# Patient Record
Sex: Female | Born: 1965 | Race: White | Hispanic: No | Marital: Married | Smoking: Never smoker
Health system: Southern US, Community
[De-identification: ages and names within clinical notes are randomized; demographics above are authoritative.]

## PROBLEM LIST (undated history)

## (undated) DIAGNOSIS — N950 Postmenopausal bleeding: Secondary | ICD-10-CM

## (undated) DIAGNOSIS — J45909 Unspecified asthma, uncomplicated: Secondary | ICD-10-CM

## (undated) DIAGNOSIS — F419 Anxiety disorder, unspecified: Secondary | ICD-10-CM

## (undated) DIAGNOSIS — D649 Anemia, unspecified: Secondary | ICD-10-CM

## (undated) DIAGNOSIS — M1990 Unspecified osteoarthritis, unspecified site: Secondary | ICD-10-CM

## (undated) DIAGNOSIS — F32A Depression, unspecified: Secondary | ICD-10-CM

## (undated) HISTORY — DX: Unspecified osteoarthritis, unspecified site: M19.90

## (undated) HISTORY — DX: Anemia, unspecified: D64.9

## (undated) HISTORY — DX: Anxiety disorder, unspecified: F41.9

## (undated) HISTORY — DX: Postmenopausal bleeding: N95.0

## (undated) HISTORY — DX: Unspecified asthma, uncomplicated: J45.909

## (undated) HISTORY — DX: Depression, unspecified: F32.A

## (undated) HISTORY — PX: BREAST BIOPSY: SHX20

---
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
## 1998-06-27 ENCOUNTER — Other Ambulatory Visit: Admission: RE | Admit: 1998-06-27 | Discharge: 1998-06-27 | Payer: Self-pay | Admitting: Obstetrics and Gynecology

## 1999-07-20 ENCOUNTER — Other Ambulatory Visit: Admission: RE | Admit: 1999-07-20 | Discharge: 1999-07-20 | Payer: Self-pay | Admitting: Obstetrics and Gynecology

## 1999-08-31 HISTORY — PX: OTHER SURGICAL HISTORY: SHX169

## 2000-01-04 ENCOUNTER — Other Ambulatory Visit: Admission: RE | Admit: 2000-01-04 | Discharge: 2000-01-04 | Payer: Self-pay | Admitting: Obstetrics and Gynecology

## 2001-07-29 ENCOUNTER — Emergency Department (HOSPITAL_COMMUNITY): Admission: EM | Admit: 2001-07-29 | Discharge: 2001-07-30 | Payer: Self-pay | Admitting: Emergency Medicine

## 2001-07-29 ENCOUNTER — Encounter: Payer: Self-pay | Admitting: Emergency Medicine

## 2001-08-07 ENCOUNTER — Emergency Department (HOSPITAL_COMMUNITY): Admission: EM | Admit: 2001-08-07 | Discharge: 2001-08-07 | Payer: Self-pay

## 2001-12-13 ENCOUNTER — Ambulatory Visit (HOSPITAL_COMMUNITY): Admission: RE | Admit: 2001-12-13 | Discharge: 2001-12-13 | Payer: Self-pay | Admitting: Family Medicine

## 2001-12-13 ENCOUNTER — Encounter: Payer: Self-pay | Admitting: Family Medicine

## 2002-09-19 ENCOUNTER — Other Ambulatory Visit: Admission: RE | Admit: 2002-09-19 | Discharge: 2002-09-19 | Payer: Self-pay | Admitting: Obstetrics and Gynecology

## 2003-02-19 ENCOUNTER — Encounter: Admission: RE | Admit: 2003-02-19 | Discharge: 2003-02-19 | Payer: Self-pay | Admitting: Sports Medicine

## 2003-10-23 ENCOUNTER — Other Ambulatory Visit: Admission: RE | Admit: 2003-10-23 | Discharge: 2003-10-23 | Payer: Self-pay | Admitting: Obstetrics and Gynecology

## 2004-06-05 ENCOUNTER — Encounter
Admission: RE | Admit: 2004-06-05 | Discharge: 2004-06-05 | Payer: Self-pay | Admitting: Physical Medicine and Rehabilitation

## 2004-10-20 ENCOUNTER — Other Ambulatory Visit: Admission: RE | Admit: 2004-10-20 | Discharge: 2004-10-20 | Payer: Self-pay | Admitting: Obstetrics and Gynecology

## 2005-10-25 ENCOUNTER — Other Ambulatory Visit: Admission: RE | Admit: 2005-10-25 | Discharge: 2005-10-25 | Payer: Self-pay | Admitting: Obstetrics and Gynecology

## 2006-12-08 ENCOUNTER — Encounter: Admission: RE | Admit: 2006-12-08 | Discharge: 2006-12-08 | Payer: Self-pay | Admitting: Obstetrics and Gynecology

## 2010-10-14 ENCOUNTER — Other Ambulatory Visit: Payer: Self-pay | Admitting: Obstetrics and Gynecology

## 2010-10-14 DIAGNOSIS — R928 Other abnormal and inconclusive findings on diagnostic imaging of breast: Secondary | ICD-10-CM

## 2010-10-20 ENCOUNTER — Ambulatory Visit
Admission: RE | Admit: 2010-10-20 | Discharge: 2010-10-20 | Disposition: A | Discharge status: Home / Self Care | Payer: Managed Care, Other (non HMO) | Source: Ambulatory Visit | Attending: Obstetrics and Gynecology | Admitting: Obstetrics and Gynecology

## 2010-10-20 DIAGNOSIS — R928 Other abnormal and inconclusive findings on diagnostic imaging of breast: Secondary | ICD-10-CM

## 2017-10-14 ENCOUNTER — Other Ambulatory Visit: Payer: Self-pay | Admitting: Obstetrics and Gynecology

## 2017-10-14 DIAGNOSIS — R928 Other abnormal and inconclusive findings on diagnostic imaging of breast: Secondary | ICD-10-CM

## 2017-10-19 ENCOUNTER — Ambulatory Visit
Admission: RE | Admit: 2017-10-19 | Discharge: 2017-10-19 | Disposition: A | Discharge status: Home / Self Care | Source: Ambulatory Visit | Attending: Obstetrics and Gynecology | Admitting: Obstetrics and Gynecology

## 2017-10-19 ENCOUNTER — Ambulatory Visit
Admission: RE | Admit: 2017-10-19 | Discharge: 2017-10-19 | Disposition: A | Discharge status: Home / Self Care | Payer: Self-pay | Source: Ambulatory Visit | Attending: Obstetrics and Gynecology | Admitting: Obstetrics and Gynecology

## 2017-10-19 ENCOUNTER — Other Ambulatory Visit: Payer: Self-pay | Admitting: Obstetrics and Gynecology

## 2017-10-19 ENCOUNTER — Ambulatory Visit
Admission: RE | Admit: 2017-10-19 | Discharge: 2017-10-19 | Disposition: A | Discharge status: Home / Self Care | Payer: Managed Care, Other (non HMO) | Source: Ambulatory Visit | Attending: Obstetrics and Gynecology | Admitting: Obstetrics and Gynecology

## 2017-10-19 DIAGNOSIS — N6310 Unspecified lump in the right breast, unspecified quadrant: Secondary | ICD-10-CM

## 2017-10-19 DIAGNOSIS — R928 Other abnormal and inconclusive findings on diagnostic imaging of breast: Secondary | ICD-10-CM

## 2019-08-28 IMAGING — MG DIGITAL DIAGNOSTIC UNILATERAL RIGHT MAMMOGRAM WITH TOMO AND CAD
6 series · 6 of 18 positions shown · non-contrast
Comparison: 10/12/2017 and earlier

CLINICAL DATA: Patient returns after screening study for evaluation
of RIGHT breast asymmetry.

EXAM:
DIGITAL DIAGNOSTIC RIGHT MAMMOGRAM WITH CAD AND TOMO
ULTRASOUND RIGHT BREAST

[R MLO synth-2D (1 of 2)]
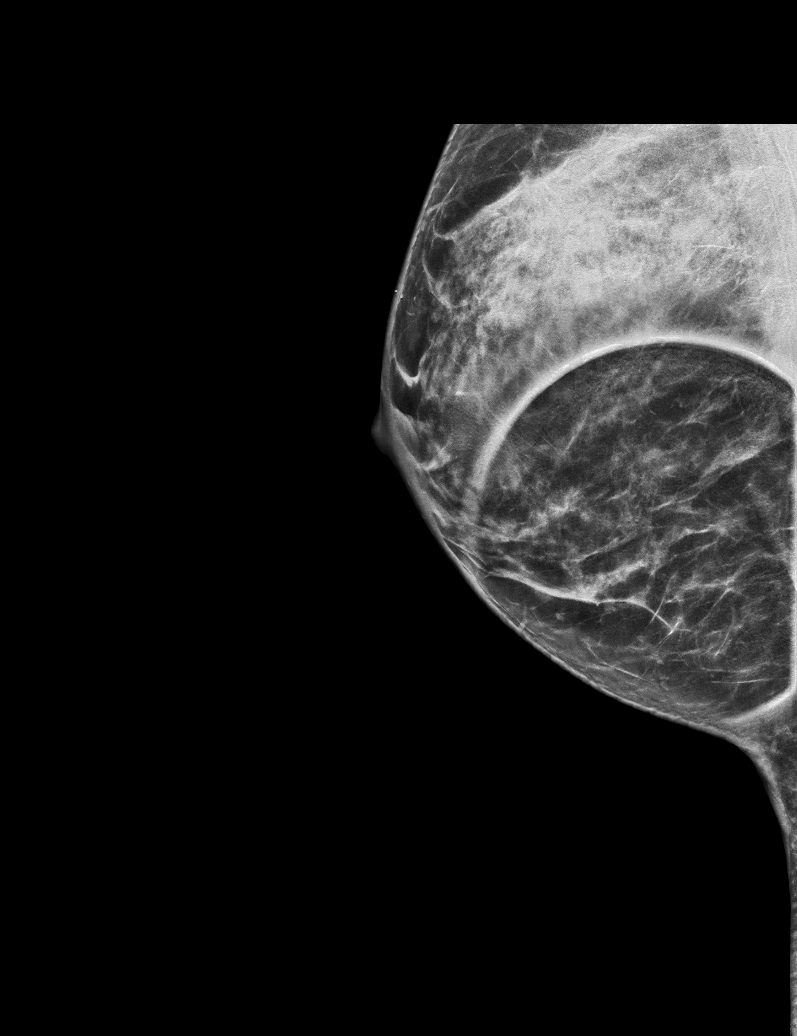

[R MLO synth-2D (2 of 2)]
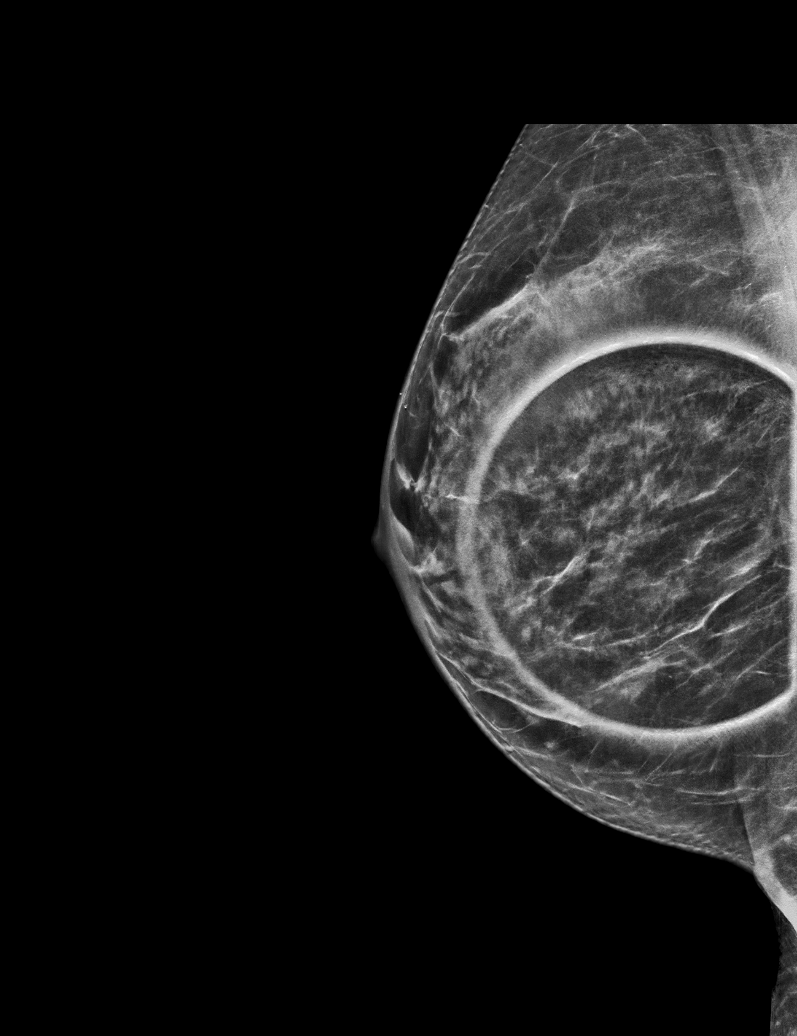

[R CC synth-2D]
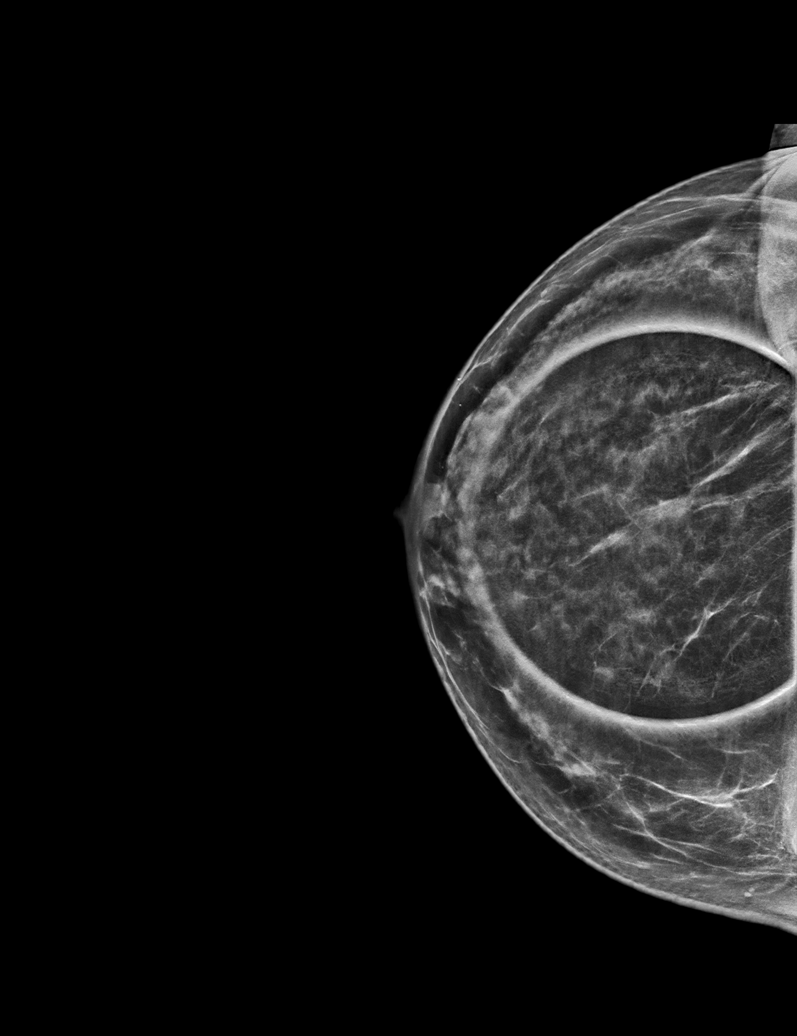

[R CC tomo · tomo slice 35/69.0]
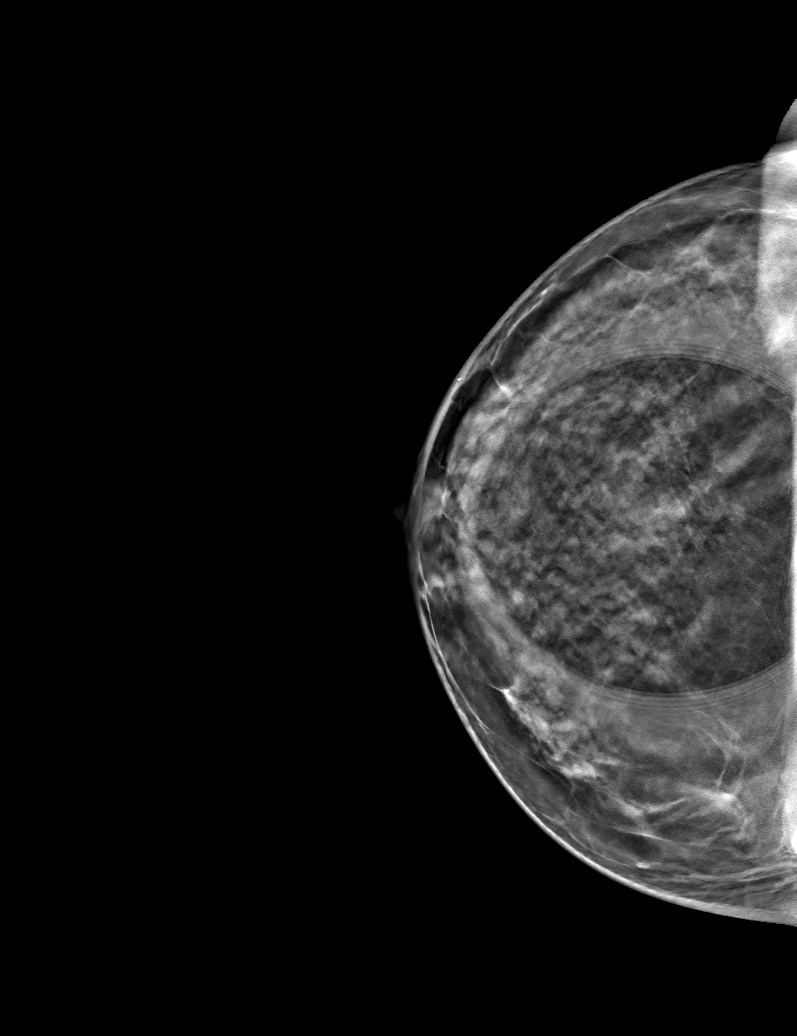

[R MLO tomo (1 of 2) · tomo slice 32/63.0]
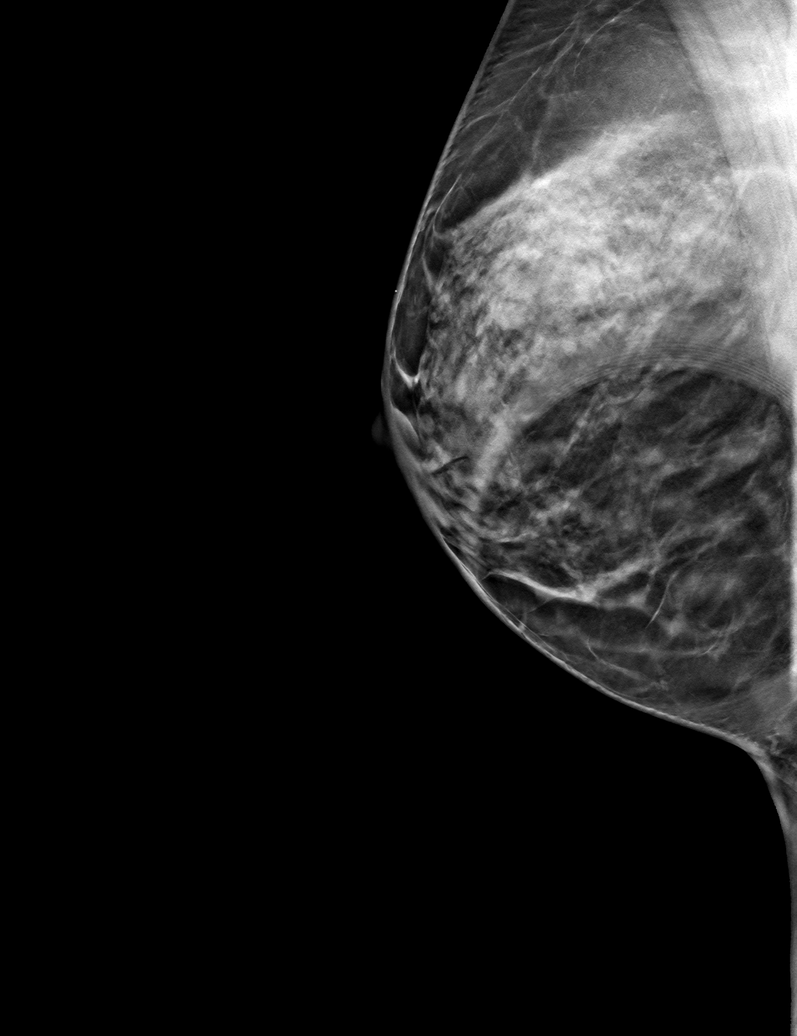

[R MLO tomo (2 of 2) · tomo slice 33/65.0]
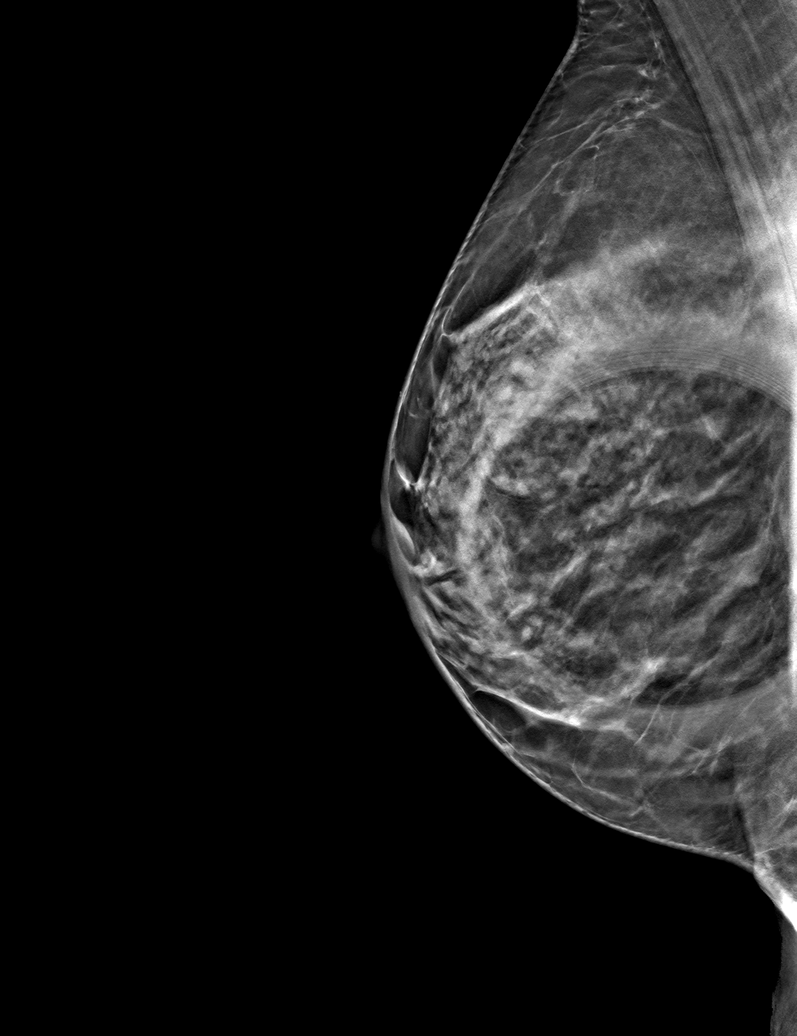

[6 of 18 positions shown; findings below may reference images not displayed]

ACR Breast Density Category d: The breast tissue is extremely dense,
which lowers the sensitivity of mammography.
FINDINGS: Additional 3-D images are performed. These views demonstrate no
persistent distortion or mass in the LOWER central portion of the
RIGHT breast.

Mammographic images were processed with CAD.

On physical exam, I palpate no abnormality in the LOWER central
aspect of the RIGHT breast. I palpate no abnormality in the
retroareolar region.

Targeted ultrasound is performed, showing a group of cysts in the 6
o'clock location RIGHT breast 3 centimeters from the nipple.
Measured together these are 1.3 x 0.4 x 1.1 centimeters.

In the 7 o'clock retroareolar region of the RIGHT breast there is an
irregular hypoechoic radially oriented possibly intraductal mass
measuring 1.6 x 0.6 x 0.6 centimeters. There is associated internal
blood flow. Evaluation of the RIGHT axilla is negative for
adenopathy.
IMPRESSION: 1. No suspicious abnormality in the LOWER portion of the RIGHT
breast. Benign cysts are identified in this region.
2. Suspicious mass in the 7 o'clock retroareolar location of the
RIGHT breast, possibly representing intraductal papilloma.
3. No adenopathy by ultrasound.

RECOMMENDATION:
Ultrasound-guided core biopsy of mass in the 7 o'clock location of
the RIGHT breast.

I have discussed the findings and recommendations with the patient.
Results were also provided in writing at the conclusion of the
visit. If applicable, a reminder letter will be sent to the patient
regarding the next appointment.

BI-RADS CATEGORY  4: Suspicious.

## 2023-02-22 ENCOUNTER — Other Ambulatory Visit: Payer: Self-pay | Admitting: Obstetrics and Gynecology

## 2023-02-22 DIAGNOSIS — Z800 Family history of malignant neoplasm of digestive organs: Secondary | ICD-10-CM

## 2023-03-08 ENCOUNTER — Ambulatory Visit
Admission: RE | Admit: 2023-03-08 | Discharge: 2023-03-08 | Disposition: A | Discharge status: Home / Self Care | Source: Ambulatory Visit | Attending: Obstetrics and Gynecology | Admitting: Obstetrics and Gynecology

## 2023-03-08 DIAGNOSIS — Z800 Family history of malignant neoplasm of digestive organs: Secondary | ICD-10-CM

## 2023-03-08 MED ORDER — GADOPICLENOL 0.5 MMOL/ML IV SOLN
7.5000 mL | Freq: Once | INTRAVENOUS | Status: AC | PRN
  Administered 2023-03-08: 7.5 mL via INTRAVENOUS

## 2023-03-29 ENCOUNTER — Encounter (HOSPITAL_BASED_OUTPATIENT_CLINIC_OR_DEPARTMENT_OTHER): Payer: Self-pay | Admitting: Obstetrics and Gynecology

## 2023-03-29 ENCOUNTER — Other Ambulatory Visit: Payer: Self-pay

## 2023-03-29 NOTE — Progress Notes (Signed)
Spoke w/ via phone for pre-op interview---pt Lab needs dos---- cbc t & s, hiv              Lab results------none COVID test -----patient states asymptomatic no test needed Arrive at -------530 am 04-04-2023 NPO after MN NO Solid Food.  Clear liquids from MN until---430 am Med rec completed Medications to take morning of surgery -----lexapro, zyrtec d, albuterol inhaler prn/bring inhaler Diabetic medication -----n/a Patient instructed no nail polish to be worn day of surgery Patient instructed to bring photo id and insurance card day of surgery Patient aware to have Driver (ride ) / caregiver    husband jon samuels for 24 hours after surgery  Patient Special Instructions -----none Pre-Op special Instructions -----none Patient verbalized understanding of instructions that were given at this phone interview. Patient denies shortness of breath, chest pain, fever, cough at this phone interview.

## 2023-03-29 NOTE — H&P (Signed)
Patient name   Paige Reynolds, mowad DICTATION# [id number] YNW#[id number]  [name], MD 03/29/2023 4:43 AM

## 2023-03-29 NOTE — H&P (Signed)
Paige Reynolds, Paige Reynolds MEDICAL RECORD NO: [id number] ACCOUNT NO: [id number] DATE OF BIRTH: November 14, 1965 FACILITY: [facility] LOCATION: [facility] PHYSICIAN: Juluis Mire, MD  History and Physical   DATE OF ADMISSION: 03/28/2023  Date of surgery 04/04/2023 at [facility] outpatient area.  HISTORY OF PRESENT ILLNESS:  The patient is a 57 year old nulligravid female that comes in for hysteroscopy, D and C.  She is postmenopausal and has been on hormone replacement therapy.  She has been having abnormal bleeding on hormone replacement  therapy.  On 07/09 of this year, we did a saline infusion ultrasound that revealed an endometrial polypoid-like out growth.  We subsequently attempted hysteroscopy in the office, but due to cervical stenosis, we were unsuccessful.  She is brought in at  the present time to undergo hysteroscopy, D and C for evaluation and removal of endometrial polyp.  ALLERGIES:  The patient has no known drug allergies.  MEDICATIONS:  She is on hormone replacement therapy in the form of estradiol transdermal patch and oral micronized progesterone.  She is also on Lexapro.  PAST MEDICAL HISTORY:  She has had usual childhood diseases without any significant sequelae.  PAST SURGICAL HISTORY:  She has had a tonsillectomy, excision of a melanoma, and breast biopsy.  SOCIAL HISTORY:  Reveals no tobacco, alcohol use.  FAMILY HISTORY:  Noncontributory.  REVIEW OF SYSTEMS:  Noncontributory.  PHYSICAL EXAMINATION:. VITAL SIGNS:  The patient is afebrile, stable vital signs. HEENT:  The patient is normocephalic.  Pupils equal, round, reactive to light and accommodation.  Extraocular movements are intact.  Sclerae and conjunctivae were clear.  Oropharynx was clear. NECK:  Thyroid is nonpalpable. LUNGS:  Clear to auscultation and percussion. CARDIOVASCULAR:  Regular rhythm and rate without murmurs or gallops. ABDOMEN:  Benign.  No mass, organomegaly, or  tenderness. PELVIC:  Normal external genitalia.  Vaginal mucosa is clear.  Cervix unremarkable.  Uterus normal size, shape, and contour.  Adnexa free of masses or tenderness. EXTREMITIES:  Trace edema. NEUROLOGIC:  Grossly within normal limits.  ASSESSMENT:  Abnormal uterine bleeding on hormone replacement therapy with evidence of an endometrial polyp.  PLAN:  The patient will undergo hysteroscopy, D and C.  The nature of the procedure have been discussed.  The risks have been explained including the risk of infection.  The risk of hemorrhage that could require transfusion with risk of AIDS or  hepatitis.  Excessive bleeding could require hysterectomy.  There is a risk of perforation with injury to adjacent organs such as bowel requiring exploratory surgery.  Risk of deep venous thrombosis and pulmonary emboli.  The patient expressed  understanding of indications and risk.       PAA D: 03/29/2023 4:42:55 am T: 03/29/2023 5:13:00 am  JOB: [id number]/ [id number]

## 2023-03-30 ENCOUNTER — Encounter (HOSPITAL_BASED_OUTPATIENT_CLINIC_OR_DEPARTMENT_OTHER): Payer: Self-pay | Admitting: Obstetrics and Gynecology

## 2023-04-04 ENCOUNTER — Encounter (HOSPITAL_BASED_OUTPATIENT_CLINIC_OR_DEPARTMENT_OTHER): Payer: Self-pay | Admitting: Obstetrics and Gynecology

## 2023-04-04 ENCOUNTER — Ambulatory Visit (HOSPITAL_BASED_OUTPATIENT_CLINIC_OR_DEPARTMENT_OTHER): Admitting: Anesthesiology

## 2023-04-04 ENCOUNTER — Other Ambulatory Visit: Payer: Self-pay

## 2023-04-04 ENCOUNTER — Encounter (HOSPITAL_BASED_OUTPATIENT_CLINIC_OR_DEPARTMENT_OTHER)
Admission: RE | Disposition: A | Discharge status: Home / Self Care | Payer: Self-pay | Source: Home / Self Care | Attending: Obstetrics and Gynecology

## 2023-04-04 ENCOUNTER — Ambulatory Visit (HOSPITAL_BASED_OUTPATIENT_CLINIC_OR_DEPARTMENT_OTHER)
Admission: RE | Admit: 2023-04-04 | Discharge: 2023-04-04 | Disposition: A | Discharge status: Home / Self Care | Attending: Obstetrics and Gynecology | Admitting: Obstetrics and Gynecology

## 2023-04-04 DIAGNOSIS — Z79890 Hormone replacement therapy (postmenopausal): Secondary | ICD-10-CM | POA: Insufficient documentation

## 2023-04-04 DIAGNOSIS — N882 Stricture and stenosis of cervix uteri: Secondary | ICD-10-CM | POA: Insufficient documentation

## 2023-04-04 DIAGNOSIS — N840 Polyp of corpus uteri: Secondary | ICD-10-CM | POA: Diagnosis present

## 2023-04-04 DIAGNOSIS — N950 Postmenopausal bleeding: Secondary | ICD-10-CM | POA: Diagnosis not present

## 2023-04-04 DIAGNOSIS — Z01818 Encounter for other preprocedural examination: Secondary | ICD-10-CM

## 2023-04-04 HISTORY — PX: DILATATION & CURETTAGE/HYSTEROSCOPY WITH MYOSURE: SHX6511

## 2023-04-04 LAB — CBC
HCT: 36.6 % (ref 36.0–46.0)
Hemoglobin: 11.5 g/dL — ABNORMAL LOW (ref 12.0–15.0)
MCH: 26.7 pg (ref 26.0–34.0)
MCHC: 31.4 g/dL (ref 30.0–36.0)
MCV: 85.1 fL (ref 80.0–100.0)
Platelets: 183 10*3/uL (ref 150–400)
RBC: 4.3 MIL/uL (ref 3.87–5.11)
RDW: 13.7 % (ref 11.5–15.5)
WBC: 4.3 10*3/uL (ref 4.0–10.5)
nRBC: 0 % (ref 0.0–0.2)

## 2023-04-04 LAB — TYPE AND SCREEN
ABO/RH(D): O POS
Antibody Screen: NEGATIVE

## 2023-04-04 LAB — HIV ANTIBODY (ROUTINE TESTING W REFLEX): HIV Screen 4th Generation wRfx: NONREACTIVE

## 2023-04-04 LAB — ABO/RH: ABO/RH(D): O POS

## 2023-04-04 SURGERY — DILATATION & CURETTAGE/HYSTEROSCOPY WITH MYOSURE
Anesthesia: General

## 2023-04-04 MED ORDER — ACETAMINOPHEN 500 MG PO TABS
ORAL_TABLET | ORAL | Status: AC
  Filled 2023-04-04: qty 2

## 2023-04-04 MED ORDER — ONDANSETRON HCL 4 MG/2ML IJ SOLN
INTRAMUSCULAR | Status: AC
  Filled 2023-04-04: qty 2

## 2023-04-04 MED ORDER — GLYCOPYRROLATE PF 0.2 MG/ML IJ SOSY
PREFILLED_SYRINGE | INTRAMUSCULAR | Status: AC
  Filled 2023-04-04: qty 1

## 2023-04-04 MED ORDER — GLYCOPYRROLATE PF 0.2 MG/ML IJ SOSY
PREFILLED_SYRINGE | INTRAMUSCULAR | Status: DC | PRN
  Administered 2023-04-04: .2 mg via INTRAVENOUS

## 2023-04-04 MED ORDER — OXYCODONE HCL 5 MG/5ML PO SOLN: 5.0000 mg | Freq: Once | ORAL | Status: DC | PRN

## 2023-04-04 MED ORDER — ACETAMINOPHEN 500 MG PO TABS
1000.0000 mg | ORAL_TABLET | Freq: Once | ORAL | Status: AC
  Administered 2023-04-04: 1000 mg via ORAL

## 2023-04-04 MED ORDER — FENTANYL CITRATE (PF) 100 MCG/2ML IJ SOLN
INTRAMUSCULAR | Status: DC | PRN
  Administered 2023-04-04 (×2): 50 ug via INTRAVENOUS

## 2023-04-04 MED ORDER — KETOROLAC TROMETHAMINE 30 MG/ML IJ SOLN
INTRAMUSCULAR | Status: AC
  Filled 2023-04-04: qty 1

## 2023-04-04 MED ORDER — FENTANYL CITRATE (PF) 100 MCG/2ML IJ SOLN
INTRAMUSCULAR | Status: AC
  Filled 2023-04-04: qty 2

## 2023-04-04 MED ORDER — LIDOCAINE HCL (PF) 2 % IJ SOLN
INTRAMUSCULAR | Status: AC
  Filled 2023-04-04: qty 5

## 2023-04-04 MED ORDER — FENTANYL CITRATE (PF) 100 MCG/2ML IJ SOLN
25.0000 ug | INTRAMUSCULAR | Status: DC | PRN
  Administered 2023-04-04: 50 ug via INTRAVENOUS
  Administered 2023-04-04: 25 ug via INTRAVENOUS
  Administered 2023-04-04: 50 ug via INTRAVENOUS
  Administered 2023-04-04: 25 ug via INTRAVENOUS

## 2023-04-04 MED ORDER — KETOROLAC TROMETHAMINE 30 MG/ML IJ SOLN
INTRAMUSCULAR | Status: DC | PRN
  Administered 2023-04-04: 30 mg via INTRAVENOUS

## 2023-04-04 MED ORDER — PROPOFOL 10 MG/ML IV BOLUS
INTRAVENOUS | Status: DC | PRN
  Administered 2023-04-04: 200 mg via INTRAVENOUS

## 2023-04-04 MED ORDER — WHITE PETROLATUM EX OINT
TOPICAL_OINTMENT | CUTANEOUS | Status: AC
  Filled 2023-04-04: qty 5

## 2023-04-04 MED ORDER — POVIDONE-IODINE 10 % EX SWAB: 2.0000 | Freq: Once | CUTANEOUS | Status: DC

## 2023-04-04 MED ORDER — ONDANSETRON HCL 4 MG/2ML IJ SOLN
INTRAMUSCULAR | Status: DC | PRN
Start: 2023-04-04 — End: 2023-04-04
  Administered 2023-04-04: 4 mg via INTRAVENOUS

## 2023-04-04 MED ORDER — CEFAZOLIN SODIUM-DEXTROSE 2-4 GM/100ML-% IV SOLN
INTRAVENOUS | Status: AC
  Filled 2023-04-04: qty 100

## 2023-04-04 MED ORDER — LIDOCAINE 2% (20 MG/ML) 5 ML SYRINGE
INTRAMUSCULAR | Status: DC | PRN
  Administered 2023-04-04: 60 mg via INTRAVENOUS

## 2023-04-04 MED ORDER — LACTATED RINGERS IV SOLN: INTRAVENOUS | Status: DC

## 2023-04-04 MED ORDER — MIDAZOLAM HCL 2 MG/2ML IJ SOLN
INTRAMUSCULAR | Status: DC | PRN
  Administered 2023-04-04: 2 mg via INTRAVENOUS

## 2023-04-04 MED ORDER — CEFAZOLIN SODIUM-DEXTROSE 2-4 GM/100ML-% IV SOLN
2.0000 g | INTRAVENOUS | Status: AC
  Administered 2023-04-04: 2 g via INTRAVENOUS

## 2023-04-04 MED ORDER — PROMETHAZINE HCL 25 MG/ML IJ SOLN: 6.2500 mg | INTRAMUSCULAR | Status: DC | PRN

## 2023-04-04 MED ORDER — DEXAMETHASONE SODIUM PHOSPHATE 10 MG/ML IJ SOLN
INTRAMUSCULAR | Status: AC
  Filled 2023-04-04: qty 1

## 2023-04-04 MED ORDER — PROPOFOL 10 MG/ML IV BOLUS
INTRAVENOUS | Status: AC
  Filled 2023-04-04: qty 20

## 2023-04-04 MED ORDER — MIDAZOLAM HCL 2 MG/2ML IJ SOLN
INTRAMUSCULAR | Status: AC
  Filled 2023-04-04: qty 2

## 2023-04-04 MED ORDER — DEXAMETHASONE SODIUM PHOSPHATE 10 MG/ML IJ SOLN
INTRAMUSCULAR | Status: DC | PRN
  Administered 2023-04-04: 10 mg via INTRAVENOUS

## 2023-04-04 MED ORDER — KETOROLAC TROMETHAMINE 30 MG/ML IJ SOLN: 30.0000 mg | Freq: Once | INTRAMUSCULAR | Status: DC | PRN

## 2023-04-04 MED ORDER — OXYCODONE HCL 5 MG PO TABS: 5.0000 mg | ORAL_TABLET | Freq: Once | ORAL | Status: DC | PRN

## 2023-04-04 MED ORDER — AMISULPRIDE (ANTIEMETIC) 5 MG/2ML IV SOLN: 10.0000 mg | Freq: Once | INTRAVENOUS | Status: DC | PRN

## 2023-04-04 SURGICAL SUPPLY — 18 items
GAUZE 4X4 16PLY ~~LOC~~+RFID DBL (SPONGE) ×4 IMPLANT
GLOVE BIO SURGEON STRL SZ7 (GLOVE) ×4 IMPLANT
GOWN STRL REUS W/TWL XL LVL3 (GOWN DISPOSABLE) ×2 IMPLANT
IV NS IRRIG 3000ML ARTHROMATIC (IV SOLUTION) ×2 IMPLANT
KIT PROCEDURE FLUENT (KITS) ×2 IMPLANT
KIT TURNOVER CYSTO (KITS) ×2 IMPLANT
NDL SPNL 18GX3.5 QUINCKE PK (NEEDLE) ×2 IMPLANT
NEEDLE SPNL 18GX3.5 QUINCKE PK (NEEDLE) ×1 IMPLANT
PACK VAGINAL MINOR WOMEN LF (CUSTOM PROCEDURE TRAY) ×2 IMPLANT
PAD OB MATERNITY 4.3X12.25 (PERSONAL CARE ITEMS) ×2 IMPLANT
PAD PREP 24X48 CUFFED NSTRL (MISCELLANEOUS) ×2 IMPLANT
SEAL ROD LENS SCOPE MYOSURE (ABLATOR) ×2 IMPLANT
SLEEVE SCD COMPRESS KNEE MED (STOCKING) ×2 IMPLANT
SUT CHROMIC 2 0 SH (SUTURE) IMPLANT
SUT CHROMIC 3 0 SH 27 (SUTURE) IMPLANT
TOWEL OR 17X24 6PK STRL BLUE (TOWEL DISPOSABLE) ×4 IMPLANT
TUBE CONNECTING 12X1/4 (SUCTIONS) IMPLANT
YANKAUER SUCT BULB TIP NO VENT (SUCTIONS) IMPLANT

## 2023-04-04 NOTE — Op Note (Signed)
Paige Reynolds, Paige Reynolds MEDICAL RECORD NO: [id number] ACCOUNT NO: [id number] DATE OF BIRTH: June 13, 1966 FACILITY: [facility] LOCATION: [facility] PHYSICIAN: Juluis Mire, MD  Operative Report   DATE OF PROCEDURE: 04/04/2023  PREOPERATIVE DIAGNOSIS:  Endometrial polyp.  POSTOPERATIVE DIAGNOSIS:  Endometrial polyp.  OPERATIVE PROCEDURE:  Dilatation and curettage with hysteroscopy.  SURGEON:  Juluis Mire, MD  ANESTHESIA:  General endotracheal.  ESTIMATED BLOOD LOSS:  Approximately 100 mL  PACKS AND DRAINS: None.  INTRAOPERATIVE BLOOD REPLACED: None.  COMPLICATIONS: Were.  INDICATIONS:  Dictated in history and physical.  PROCEDURE DETAILS:  The patient was taken to the OR, placed supine position.  After satisfactory level of general anesthesia was obtained, the patient was placed in the dorsal lithotomy position using Allen stirrups.  Perineum and vagina were prepped out  with Betadine, draped in sterile field.  Speculum was placed in the vaginal vault.  Cervix was secured with a Hulka tenaculum.  Uterus sounded to approximately 9 cm.  Cervix was serially dilated.  We had some trouble with dilation.  It did result in  some tears in the cervix again, as we did previously.  We eventually got into the endometrial cavity.  Hysteroscopy was performed.  I really did not see an endometrial polyp.  She had some mild thickening.  We did endometrial curettings, all sent for  pathology.  At the end of the procedure, the hysteroscope was then removed.  The patient's cervix was repaired with interrupted sutures of 2-0 chromic.  We had good hemostasis at this point in time.  The patient was taken out of dorsal lithotomy  position.  Once alert and extubated, transferred to recovery room in good condition.  Sponge, instrument and needle counts were correct by circulating nurse x2.   VAI D: 04/04/2023 8:08:57 am T: 04/04/2023 8:15:00 am  JOB: [id number]/ [id number]

## 2023-04-04 NOTE — Anesthesia Procedure Notes (Signed)
Procedure Name: LMA Insertion Date/Time: 04/04/2023 7:31 AM  Performed by: Francie Massing, CRNAPre-anesthesia Checklist: Patient identified, Emergency Drugs available, Suction available and Patient being monitored Patient Re-evaluated:Patient Re-evaluated prior to induction Oxygen Delivery Method: Circle system utilized Preoxygenation: Pre-oxygenation with 100% oxygen Induction Type: IV induction Ventilation: Mask ventilation without difficulty LMA: LMA inserted LMA Size: 4.0 Number of attempts: 1 Airway Equipment and Method: Bite block Placement Confirmation: positive ETCO2 Tube secured with: Tape Dental Injury: Teeth and Oropharynx as per pre-operative assessment

## 2023-04-04 NOTE — Brief Op Note (Signed)
04/04/2023  8:09 AM  PATIENT:  Paige Reynolds  57 y.o. female  PRE-OPERATIVE DIAGNOSIS:  POSTMENOPAUSAL BLEEDING  POST-OPERATIVE DIAGNOSIS:  POSTMENOPAUSAL BLEEDING  PROCEDURE:  Procedure(s): DILATATION & CURETTAGE/HYSTEROSCOPY WITH MYOSURE (N/A)  SURGEON:  Surgeons and Role:    * [name], MD - Primary  PHYSICIAN ASSISTANT:   ASSISTANTS: none   ANESTHESIA:   general  EBL:  minimal  BLOOD ADMINISTERED:none  DRAINS: none   LOCAL MEDICATIONS USED:  NONE  SPECIMEN: uterine currettings  DISPOSITION OF SPECIMEN:  PATHOLOGY  COUNTS:  YES  TOURNIQUET:  * No tourniquets in log *  DICTATION: .Other Dictation: Dictation Number [id number]  PLAN OF CARE: Discharge to home after PACU  PATIENT DISPOSITION:  PACU - hemodynamically stable.   Delay start of Pharmacological VTE agent (>24hrs) due to surgical blood loss or risk of bleeding: not applicable

## 2023-04-04 NOTE — Anesthesia Preprocedure Evaluation (Addendum)
Anesthesia Evaluation  Patient identified by MRN, date of birth, ID band Patient awake    Reviewed: Allergy & Precautions, NPO status , Patient's Chart, lab work & pertinent test results  Airway Mallampati: II  TM Distance: >3 FB Neck ROM: Full    Dental no notable dental hx.    Pulmonary asthma    Pulmonary exam normal        Cardiovascular negative cardio ROS Normal cardiovascular exam     Neuro/Psych  PSYCHIATRIC DISORDERS Anxiety Depression    negative neurological ROS     GI/Hepatic negative GI ROS, Neg liver ROS,,,  Endo/Other  negative endocrine ROS    Renal/GU negative Renal ROS     Musculoskeletal  (+) Arthritis ,    Abdominal   Peds  Hematology negative hematology ROS (+)   Anesthesia Other Findings POSTMENOPAUSAL BLEEDING  Reproductive/Obstetrics                             Anesthesia Physical Anesthesia Plan  ASA: 2  Anesthesia Plan: General   Post-op Pain Management:    Induction: Intravenous  PONV Risk Score and Plan: 4 or greater and Ondansetron, Dexamethasone, Midazolam, Treatment may vary due to age or medical condition and Propofol infusion  Airway Management Planned: LMA  Additional Equipment:   Intra-op Plan:   Post-operative Plan: Extubation in OR  Informed Consent: I have reviewed the patients History and Physical, chart, labs and discussed the procedure including the risks, benefits and alternatives for the proposed anesthesia with the patient or authorized representative who has indicated his/her understanding and acceptance.     Dental advisory given  Plan Discussed with: CRNA  Anesthesia Plan Comments:        Anesthesia Quick Evaluation

## 2023-04-04 NOTE — Transfer of Care (Signed)
Immediate Anesthesia Transfer of Care Note  Patient: Paige Reynolds  Procedure(s) Performed: Procedure(s) (LRB): DILATATION & CURETTAGE/HYSTEROSCOPY WITH MYOSURE (N/A)  Patient Location: PACU  Anesthesia Type: General  Level of Consciousness: awake, oriented, sedated and patient cooperative  Airway & Oxygen Therapy: Patient Spontanous Breathing and Patient connected to face mask oxygen  Post-op Assessment: Report given to PACU RN and Post -op Vital signs reviewed and stable  Post vital signs: Reviewed and stable  Complications: No apparent anesthesia complications Last Vitals:  Vitals Value Taken Time  BP 91/60 04/04/23 0822  Temp    Pulse 42 04/04/23 0827  Resp 18 04/04/23 0827  SpO2 100 % 04/04/23 0827  Vitals shown include unfiled device data.  Last Pain:  Vitals:   04/04/23 0613  TempSrc: Oral  PainSc: 0-No pain      Patients Stated Pain Goal: 8 (04/04/23 0272)  Complications: No notable events documented.

## 2023-04-04 NOTE — Discharge Instructions (Signed)
No acetaminophen/Tylenol until after 12:51 pm today if needed.  No ibuprofen, Advil, Aleve, Motrin, ketorolac, meloxicam, naproxen, or other NSAIDS until after 2:08 pm today if needed.   Post Anesthesia Home Care Instructions  Activity: Get plenty of rest for the remainder of the day. A responsible individual must stay with you for 24 hours following the procedure.  For the next 24 hours, DO NOT: -Drive a car -Advertising copywriter -Drink alcoholic beverages -Take any medication unless instructed by your physician -Make any legal decisions or sign important papers.  Meals: Start with liquid foods such as gelatin or soup. Progress to regular foods as tolerated. Avoid greasy, spicy, heavy foods. If nausea and/or vomiting occur, drink only clear liquids until the nausea and/or vomiting subsides. Call your physician if vomiting continues.  Special Instructions/Symptoms: Your throat may feel dry or sore from the anesthesia or the breathing tube placed in your throat during surgery. If this causes discomfort, gargle with warm salt water. The discomfort should disappear within 24 hours.

## 2023-04-04 NOTE — H&P (Signed)
  History and physical exam unchanged 

## 2023-04-05 NOTE — Anesthesia Postprocedure Evaluation (Signed)
Anesthesia Post Note  Patient: Talishia E Lassiter-Samuels  Procedure(s) Performed: DILATATION & CURETTAGE/HYSTEROSCOPY WITH MYOSURE     Patient location during evaluation: PACU Anesthesia Type: General Level of consciousness: awake Pain management: pain level controlled Vital Signs Assessment: post-procedure vital signs reviewed and stable Respiratory status: spontaneous breathing, nonlabored ventilation and respiratory function stable Cardiovascular status: blood pressure returned to baseline and stable Postop Assessment: no apparent nausea or vomiting Anesthetic complications: no   No notable events documented.  Last Vitals:  Vitals:   04/04/23 0930 04/04/23 1000  BP: 119/70 126/78  Pulse: 64 (!) 53  Resp: 16 14  Temp: (!) 36.1 C (!) 36.3 C  SpO2: 99% 100%    Last Pain:  Vitals:   04/04/23 1000  TempSrc: Axillary  PainSc: 2                  Schuyler Behan P Nafis Farnan

## 2023-04-06 ENCOUNTER — Encounter (HOSPITAL_BASED_OUTPATIENT_CLINIC_OR_DEPARTMENT_OTHER): Payer: Self-pay | Admitting: Obstetrics and Gynecology
